# Patient Record
Sex: Female | Born: 2000 | Race: White | Smoking: Never smoker
Health system: Southern US, Community
[De-identification: ages and names within clinical notes are randomized; demographics above are authoritative.]

## PROBLEM LIST (undated history)

## (undated) VITALS — BP 111/74 | HR 69 | Temp 97.8°F | Resp 16 | Ht 65.75 in | Wt 174.8 lb

## (undated) DIAGNOSIS — F32A Depression, unspecified: Secondary | ICD-10-CM

## (undated) DIAGNOSIS — F909 Attention-deficit hyperactivity disorder, unspecified type: Secondary | ICD-10-CM

## (undated) DIAGNOSIS — F633 Trichotillomania: Secondary | ICD-10-CM

## (undated) DIAGNOSIS — F419 Anxiety disorder, unspecified: Secondary | ICD-10-CM

## (undated) DIAGNOSIS — F329 Major depressive disorder, single episode, unspecified: Secondary | ICD-10-CM

## (undated) DIAGNOSIS — T7840XA Allergy, unspecified, initial encounter: Secondary | ICD-10-CM

## (undated) DIAGNOSIS — R480 Dyslexia and alexia: Secondary | ICD-10-CM

---
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
## 2020-05-28 ENCOUNTER — Emergency Department (HOSPITAL_BASED_OUTPATIENT_CLINIC_OR_DEPARTMENT_OTHER): Payer: BC Managed Care – PPO

## 2020-05-28 ENCOUNTER — Other Ambulatory Visit: Payer: Self-pay

## 2020-05-28 ENCOUNTER — Emergency Department (HOSPITAL_BASED_OUTPATIENT_CLINIC_OR_DEPARTMENT_OTHER)
Admission: EM | Admit: 2020-05-28 | Discharge: 2020-05-28 | Disposition: A | Discharge status: Home / Self Care | Payer: BC Managed Care – PPO | Attending: Emergency Medicine | Admitting: Emergency Medicine

## 2020-05-28 ENCOUNTER — Encounter (HOSPITAL_BASED_OUTPATIENT_CLINIC_OR_DEPARTMENT_OTHER): Payer: Self-pay | Admitting: *Deleted

## 2020-05-28 DIAGNOSIS — M545 Low back pain: Secondary | ICD-10-CM | POA: Diagnosis not present

## 2020-05-28 DIAGNOSIS — M25562 Pain in left knee: Secondary | ICD-10-CM

## 2020-05-28 DIAGNOSIS — Y998 Other external cause status: Secondary | ICD-10-CM | POA: Insufficient documentation

## 2020-05-28 DIAGNOSIS — S060X9A Concussion with loss of consciousness of unspecified duration, initial encounter: Secondary | ICD-10-CM | POA: Insufficient documentation

## 2020-05-28 DIAGNOSIS — M7918 Myalgia, other site: Secondary | ICD-10-CM

## 2020-05-28 DIAGNOSIS — Y9389 Activity, other specified: Secondary | ICD-10-CM | POA: Insufficient documentation

## 2020-05-28 DIAGNOSIS — R1032 Left lower quadrant pain: Secondary | ICD-10-CM | POA: Diagnosis not present

## 2020-05-28 DIAGNOSIS — M549 Dorsalgia, unspecified: Secondary | ICD-10-CM

## 2020-05-28 DIAGNOSIS — Y9289 Other specified places as the place of occurrence of the external cause: Secondary | ICD-10-CM | POA: Diagnosis not present

## 2020-05-28 DIAGNOSIS — R519 Headache, unspecified: Secondary | ICD-10-CM | POA: Diagnosis present

## 2020-05-28 LAB — BASIC METABOLIC PANEL
Anion gap: 11 (ref 5–15)
BUN: 13 mg/dL (ref 6–20)
CO2: 21 mmol/L — ABNORMAL LOW (ref 22–32)
Calcium: 9.4 mg/dL (ref 8.9–10.3)
Chloride: 105 mmol/L (ref 98–111)
Creatinine, Ser: 0.86 mg/dL (ref 0.44–1.00)
GFR calc Af Amer: >60 mL/min (ref >60)
GFR calc non Af Amer: >60 mL/min (ref >60)
Glucose, Bld: 97 mg/dL (ref 70–99)
Potassium: 3.3 mmol/L — ABNORMAL LOW (ref 3.5–5.1)
Sodium: 137 mmol/L (ref 135–145)

## 2020-05-28 LAB — CBC WITH DIFFERENTIAL/PLATELET
Abs Immature Granulocytes: 0.11 10*3/uL — ABNORMAL HIGH (ref 0.00–0.07)
Basophils Absolute: 0.1 10*3/uL (ref 0.0–0.1)
Basophils Relative: 0 %
Eosinophils Absolute: 0.1 10*3/uL (ref 0.0–0.5)
Eosinophils Relative: 0 %
HCT: 45.2 % (ref 36.0–46.0)
Hemoglobin: 15.3 g/dL — ABNORMAL HIGH (ref 12.0–15.0)
Immature Granulocytes: 1 %
Lymphocytes Relative: 12 %
Lymphs Abs: 2.3 10*3/uL (ref 0.7–4.0)
MCH: 28.2 pg (ref 26.0–34.0)
MCHC: 33.8 g/dL (ref 30.0–36.0)
MCV: 83.4 fL (ref 80.0–100.0)
Monocytes Absolute: 1.5 10*3/uL — ABNORMAL HIGH (ref 0.1–1.0)
Monocytes Relative: 8 %
Neutro Abs: 16 10*3/uL — ABNORMAL HIGH (ref 1.7–7.7)
Neutrophils Relative %: 79 %
Platelets: 355 10*3/uL (ref 150–400)
RBC: 5.42 MIL/uL — ABNORMAL HIGH (ref 3.87–5.11)
RDW: 12.9 % (ref 11.5–15.5)
WBC: 20.2 10*3/uL — ABNORMAL HIGH (ref 4.0–10.5)
nRBC: 0 % (ref 0.0–0.2)

## 2020-05-28 MED ORDER — CYCLOBENZAPRINE HCL 10 MG PO TABS: 10.0000 mg | ORAL_TABLET | Freq: Two times a day (BID) | ORAL | 0 refills | Status: AC | PRN

## 2020-05-28 MED ORDER — MORPHINE SULFATE (PF) 4 MG/ML IV SOLN
4.0000 mg | Freq: Once | INTRAVENOUS | Status: AC
  Administered 2020-05-28: 4 mg via INTRAVENOUS
  Filled 2020-05-28: qty 1

## 2020-05-28 MED ORDER — IBUPROFEN 600 MG PO TABS: 600.0000 mg | ORAL_TABLET | Freq: Four times a day (QID) | ORAL | 0 refills | Status: AC | PRN

## 2020-05-28 MED ORDER — IOHEXOL 300 MG/ML  SOLN
100.0000 mL | Freq: Once | INTRAMUSCULAR | Status: AC | PRN
  Administered 2020-05-28: 100 mL via INTRAVENOUS

## 2020-05-28 MED ORDER — ONDANSETRON 4 MG PO TBDP: 4.0000 mg | ORAL_TABLET | Freq: Three times a day (TID) | ORAL | 0 refills | Status: AC | PRN
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
## 2020-05-28 NOTE — ED Notes (Signed)
Patient transported to CT 
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
## 2020-05-28 NOTE — ED Notes (Signed)
Several bruised areas on left knee noted during exam with PA

## 2020-05-28 NOTE — ED Notes (Signed)
Pt positioned for comfort, ice pack offered for left knee, warm blanket provided, father at bedside

## 2020-05-28 NOTE — ED Notes (Signed)
Received report from Gaetano Net RN
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
